# Patient Record
Sex: Female | Born: 2000
Health system: Southern US, Community
[De-identification: ages and names within clinical notes are randomized; demographics above are authoritative.]

## PROBLEM LIST (undated history)

## (undated) HISTORY — PX: CYST REMOVAL NECK: SHX6281

## (undated) HISTORY — PX: TONSILLECTOMY: SUR1361

## (undated) HISTORY — PX: TONSILLECTOMY: SHX5217

---
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
## 2021-01-20 ENCOUNTER — Ambulatory Visit: Payer: Managed Care, Other (non HMO) | Admitting: Gastroenterology
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
## 2021-05-07 IMAGING — US US PELVIS COMPLETE
1 series · 15 of 25 positions shown · non-contrast
Comparison: CT 09/13/2020

CLINICAL DATA: Pelvic pain, possible follicle seen in the right
adnexa on comparison CT 09/13/2020

EXAM:
TRANSABDOMINAL ULTRASOUND OF PELVIS
DOPPLER ULTRASOUND OF OVARIES
TECHNIQUE: Transabdominal ultrasound examination of the pelvis was performed
including evaluation of the uterus, ovaries, adnexal regions, and
pelvic cul-de-sac.
Color and duplex Doppler ultrasound was utilized to evaluate blood
flow to the ovaries.

[Series 1: us transvaginal non-ob · 15 of 62 slices shown]
[im 1/62]
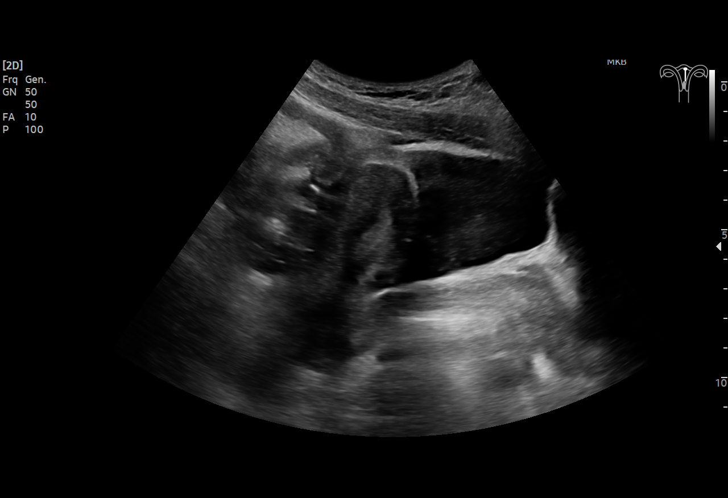
[im 6/62]
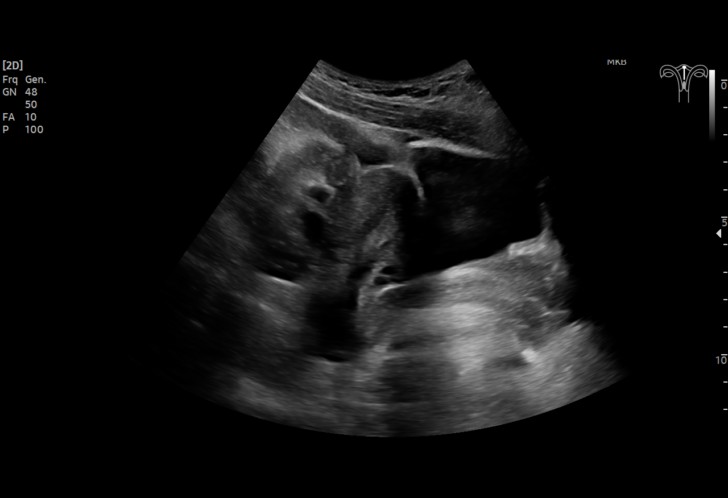
[im 11/62]
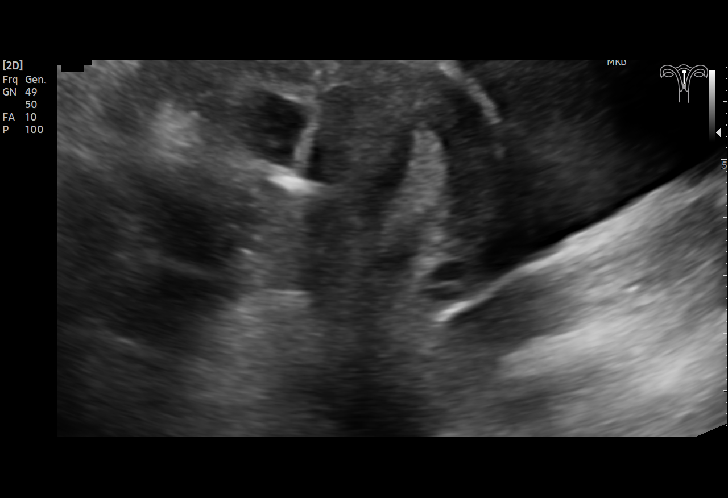
[im 13/62]
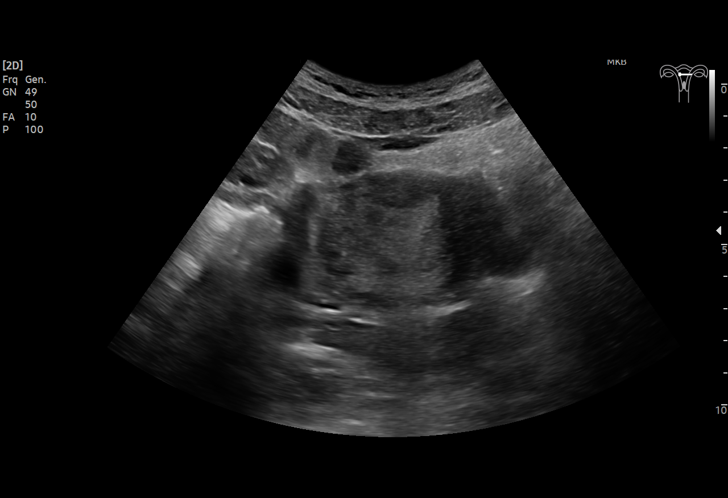
[im 18/62]
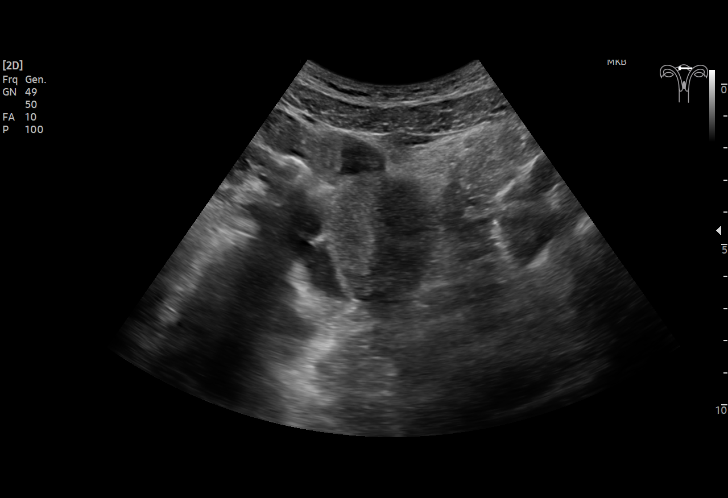
[im 23/62]
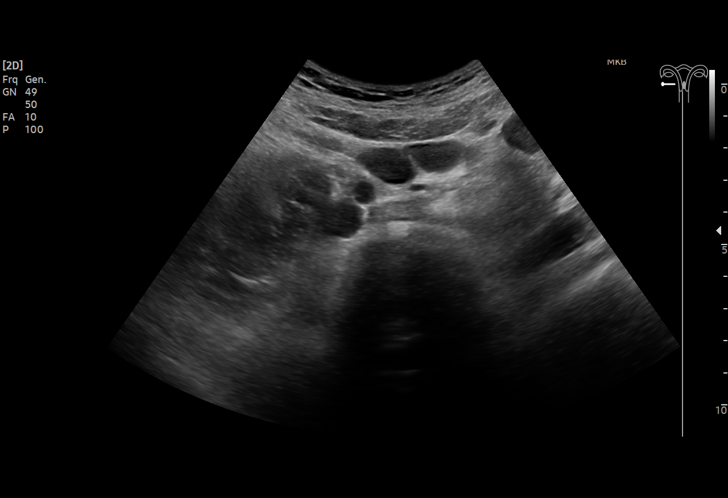
[im 26/62]
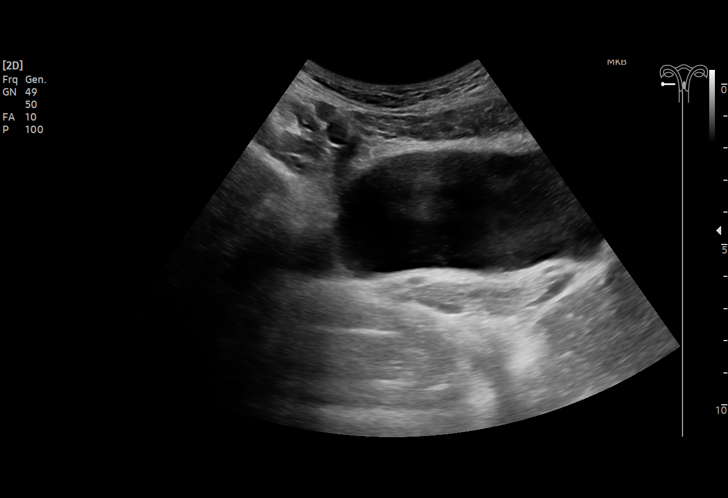
[im 31/62]
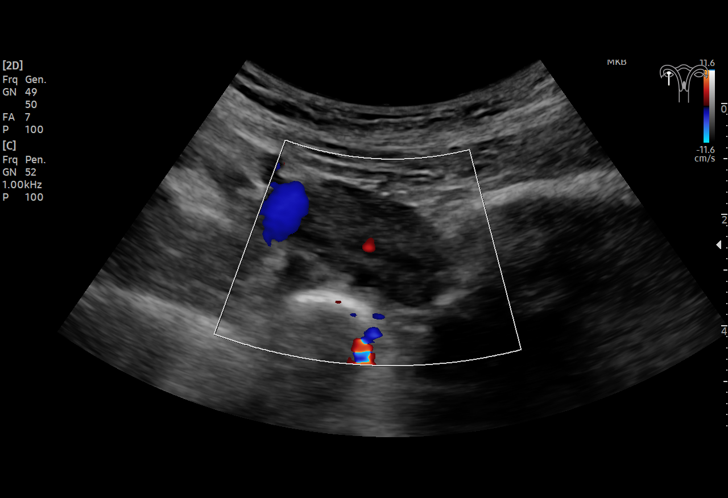
[im 36/62]
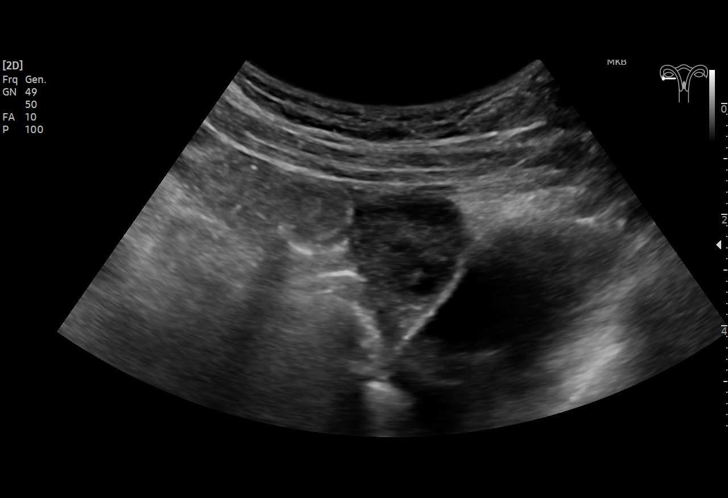
[im 39/62]
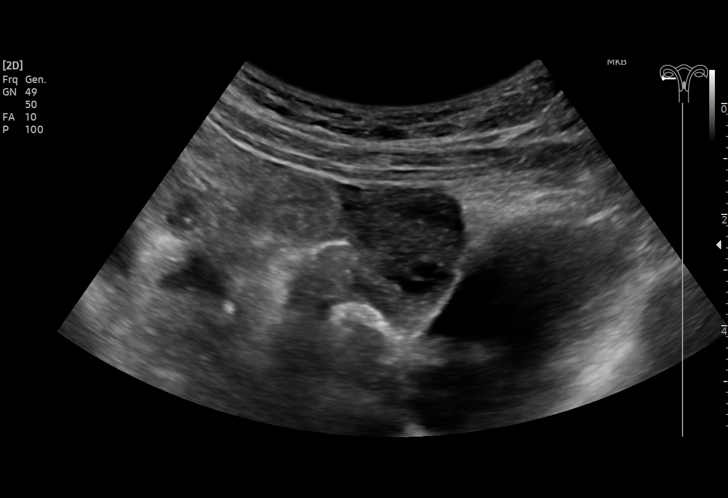
[im 44/62]
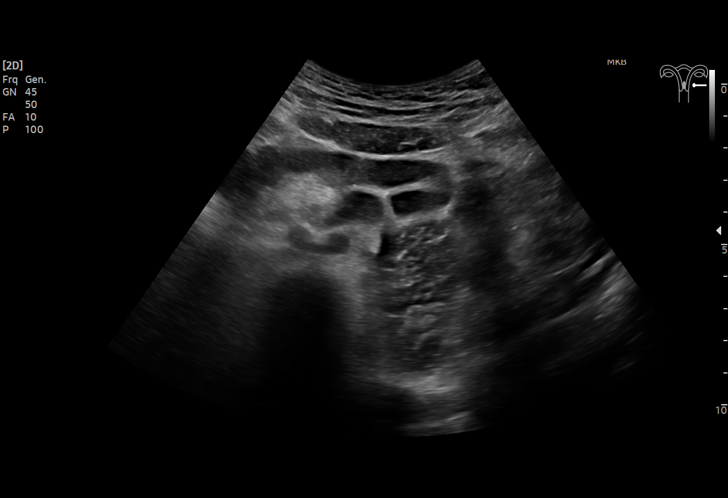
[im 49/62]
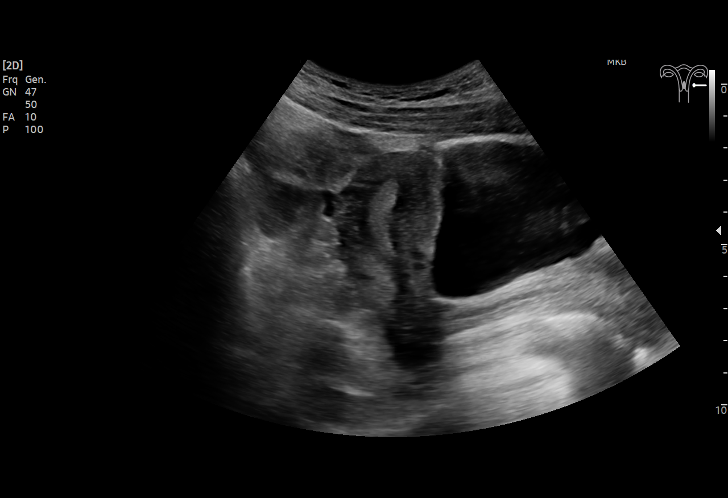
[im 51/62]
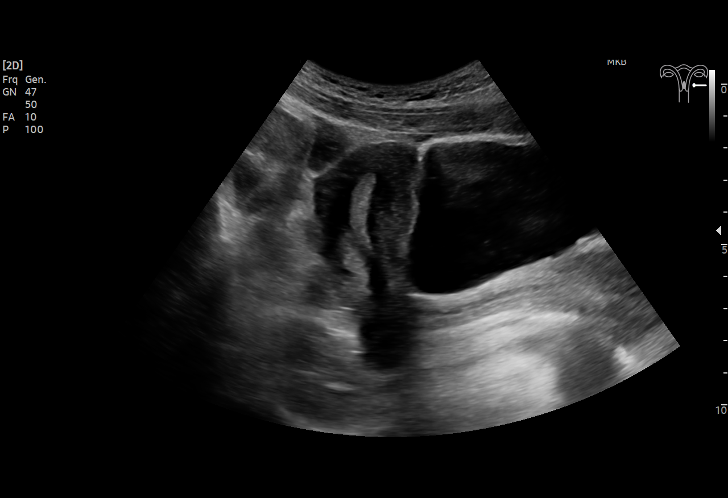
[im 56/62]
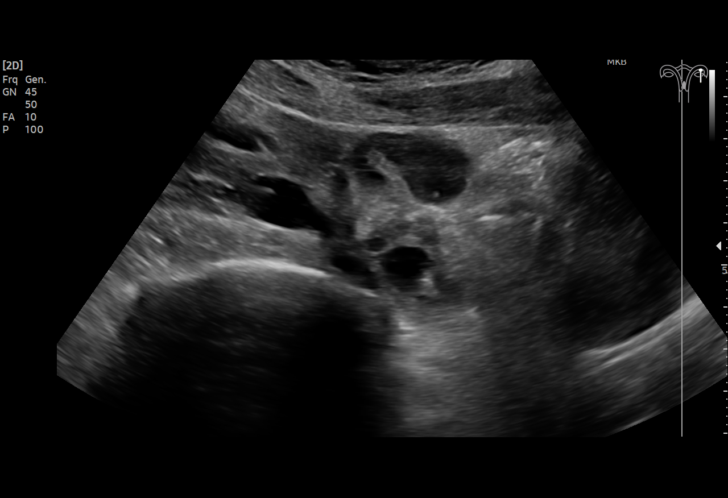
[im 62/62]
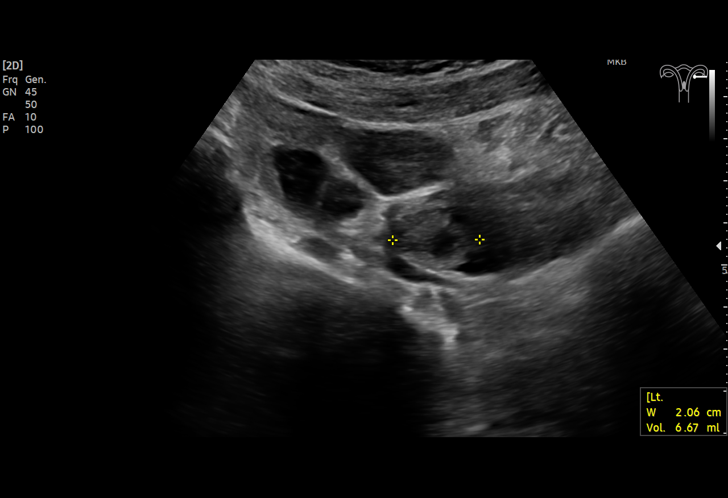

[15 of 25 positions shown; findings below may reference images not displayed]

FINDINGS: Uterus

Measurements: 6.9 x 3.3 x 5.6 cm = volume: 64.9 mL. No fibroids or
other mass visualized.

Endometrium

Thickness: 7.1 mm, non thickened.  No focal abnormality visualized.

Right ovary

Measurements: 3.0 x 1.7 x 2.1 cm = volume: 5.4 mL. No concerning
adnexal masses or lesions. Few normal follicles are present.

Left ovary

Measurements: 3.1 x 2.0 x 2.1 cm = volume: 6.7 mL. No concerning
adnexal masses or lesions. Few normal follicles are present.

Pulsed Doppler evaluation demonstrates normal low-resistance
arterial and venous waveforms in both ovaries.

Other: No free fluid.
IMPRESSION: No evidence of ovarian torsion or other acute pelvic abnormality.

Low-attenuation focus on comparison CT imaging a likely reflected a
follicle/adnexal cyst which is a normal physiologic finding. No
follow-up imaging is typically warranted for such lesions.
Additional normal small sub 3 cm follicles are noted in both ovaries
on today's examination. No further follow-up imaging is recommended.
Note: This recommendation does not apply to premenarchal patients or
to those with increased risk (genetic, family history, elevated
tumor markers or other high-risk factors) of ovarian cancer.
Reference: Radiology [DATE]):[id number].

## 2022-10-04 ENCOUNTER — Emergency Department (HOSPITAL_BASED_OUTPATIENT_CLINIC_OR_DEPARTMENT_OTHER)
Admission: EM | Admit: 2022-10-04 | Discharge: 2022-10-04 | Disposition: A | Discharge status: Home / Self Care | Payer: BC Managed Care – PPO | Attending: Emergency Medicine | Admitting: Emergency Medicine

## 2022-10-04 ENCOUNTER — Other Ambulatory Visit (HOSPITAL_BASED_OUTPATIENT_CLINIC_OR_DEPARTMENT_OTHER): Payer: Self-pay

## 2022-10-04 ENCOUNTER — Other Ambulatory Visit: Payer: Self-pay

## 2022-10-04 ENCOUNTER — Encounter (HOSPITAL_BASED_OUTPATIENT_CLINIC_OR_DEPARTMENT_OTHER): Payer: Self-pay

## 2022-10-04 DIAGNOSIS — R109 Unspecified abdominal pain: Secondary | ICD-10-CM | POA: Diagnosis not present

## 2022-10-04 DIAGNOSIS — R112 Nausea with vomiting, unspecified: Secondary | ICD-10-CM | POA: Insufficient documentation

## 2022-10-04 DIAGNOSIS — Z1152 Encounter for screening for COVID-19: Secondary | ICD-10-CM | POA: Diagnosis not present

## 2022-10-04 DIAGNOSIS — R197 Diarrhea, unspecified: Secondary | ICD-10-CM | POA: Diagnosis not present

## 2022-10-04 LAB — RESP PANEL BY RT-PCR (RSV, FLU A&B, COVID)  RVPGX2
Influenza A by PCR: NEGATIVE
Influenza B by PCR: NEGATIVE
Resp Syncytial Virus by PCR: NEGATIVE
SARS Coronavirus 2 by RT PCR: NEGATIVE

## 2022-10-04 LAB — URINALYSIS, ROUTINE W REFLEX MICROSCOPIC
Bacteria, UA: NONE SEEN
Bilirubin Urine: NEGATIVE
Glucose, UA: NEGATIVE mg/dL
Ketones, ur: 15 mg/dL — AB
Leukocytes,Ua: NEGATIVE
Nitrite: NEGATIVE
Specific Gravity, Urine: 1.033 — ABNORMAL HIGH (ref 1.005–1.030)
pH: 5.5 (ref 5.0–8.0)

## 2022-10-04 LAB — CBC WITH DIFFERENTIAL/PLATELET
Abs Immature Granulocytes: 0.02 10*3/uL (ref 0.00–0.07)
Basophils Absolute: 0 10*3/uL (ref 0.0–0.1)
Basophils Relative: 0 %
Eosinophils Absolute: 0 10*3/uL (ref 0.0–0.5)
Eosinophils Relative: 0 %
HCT: 43.1 % (ref 36.0–46.0)
Hemoglobin: 15.6 g/dL — ABNORMAL HIGH (ref 12.0–15.0)
Immature Granulocytes: 0 %
Lymphocytes Relative: 9 %
Lymphs Abs: 0.7 10*3/uL (ref 0.7–4.0)
MCH: 32.8 pg (ref 26.0–34.0)
MCHC: 36.2 g/dL — ABNORMAL HIGH (ref 30.0–36.0)
MCV: 90.5 fL (ref 80.0–100.0)
Monocytes Absolute: 0.5 10*3/uL (ref 0.1–1.0)
Monocytes Relative: 6 %
Neutro Abs: 6.4 10*3/uL (ref 1.7–7.7)
Neutrophils Relative %: 85 %
Platelets: 244 10*3/uL (ref 150–400)
RBC: 4.76 MIL/uL (ref 3.87–5.11)
RDW: 11.9 % (ref 11.5–15.5)
WBC: 7.6 10*3/uL (ref 4.0–10.5)
nRBC: 0 % (ref 0.0–0.2)

## 2022-10-04 LAB — COMPREHENSIVE METABOLIC PANEL
ALT: 21 U/L (ref 0–44)
AST: 19 U/L (ref 15–41)
Albumin: 4.8 g/dL (ref 3.5–5.0)
Alkaline Phosphatase: 48 U/L (ref 38–126)
Anion gap: 11 (ref 5–15)
BUN: 15 mg/dL (ref 6–20)
CO2: 25 mmol/L (ref 22–32)
Calcium: 10 mg/dL (ref 8.9–10.3)
Chloride: 100 mmol/L (ref 98–111)
Creatinine, Ser: 0.86 mg/dL (ref 0.44–1.00)
GFR, Estimated: >60 mL/min (ref >60)
Glucose, Bld: 103 mg/dL — ABNORMAL HIGH (ref 70–99)
Potassium: 3.4 mmol/L — ABNORMAL LOW (ref 3.5–5.1)
Sodium: 136 mmol/L (ref 135–145)
Total Bilirubin: 1.3 mg/dL — ABNORMAL HIGH (ref 0.3–1.2)
Total Protein: 7.8 g/dL (ref 6.5–8.1)

## 2022-10-04 LAB — LIPASE, BLOOD: Lipase: <10 U/L — ABNORMAL LOW (ref 11–51)

## 2022-10-04 LAB — PREGNANCY, URINE: Preg Test, Ur: NEGATIVE

## 2022-10-04 MED ORDER — ONDANSETRON HCL 4 MG PO TABS
4.0000 mg | ORAL_TABLET | Freq: Three times a day (TID) | ORAL | 0 refills | Status: AC | PRN
  Filled 2022-10-04: qty 5, 2d supply, fill #0

## 2022-10-04 MED ORDER — SUCRALFATE 1 G PO TABS
1.0000 g | ORAL_TABLET | Freq: Three times a day (TID) | ORAL | 0 refills | Status: AC
  Filled 2022-10-04: qty 21, 7d supply, fill #0

## 2022-10-04 MED ORDER — ONDANSETRON HCL 4 MG/2ML IJ SOLN
4.0000 mg | Freq: Once | INTRAMUSCULAR | Status: AC
  Administered 2022-10-04: 4 mg via INTRAVENOUS
  Filled 2022-10-04: qty 2

## 2022-10-04 MED ORDER — SODIUM CHLORIDE 0.9 % IV BOLUS
1000.0000 mL | Freq: Once | INTRAVENOUS | Status: AC
  Administered 2022-10-04: 1000 mL via INTRAVENOUS

## 2022-10-04 MED ORDER — LIDOCAINE VISCOUS HCL 2 % MT SOLN
15.0000 mL | Freq: Once | OROMUCOSAL | Status: AC
  Administered 2022-10-04: 15 mL via ORAL
  Filled 2022-10-04: qty 15

## 2022-10-04 MED ORDER — ALUM & MAG HYDROXIDE-SIMETH 200-200-20 MG/5ML PO SUSP
30.0000 mL | Freq: Once | ORAL | Status: AC
  Administered 2022-10-04: 30 mL via ORAL
  Filled 2022-10-04: qty 30

## 2022-10-04 MED ORDER — PANTOPRAZOLE SODIUM 20 MG PO TBEC
20.0000 mg | DELAYED_RELEASE_TABLET | Freq: Every day | ORAL | 0 refills | Status: AC
  Filled 2022-10-04: qty 14, 14d supply, fill #0

## 2022-10-04 NOTE — ED Notes (Signed)
Discharge paperwork given and verbally understood. 

## 2022-10-04 NOTE — ED Provider Notes (Signed)
[facility] Provider Note  CSN: [id number] Arrival date & time: 10/04/22 1230  Chief Complaint(s) Emesis  HPI Maria Arellano is a 22 y.o. female with past medical history as below, significant for no significant medical history who presents to the ED with complaint of abdominal pain, nausea, vomiting, diarrhea.  Patient accompanied by grandmother.  Patient reports that yesterday she ate very spicy food with copious amounts of hot sauce, began having epigastric discomfort, nausea, vomiting, diarrhea that is progressively worsened over the past 24 hours.  Pain described as burning, cramping sensation.  Diarrhea copious, no BRBPR or melena, no recent travel or sick contacts.  No history of prior abdominal surgery.  Past Medical History History reviewed. No pertinent past medical history. Patient Active Problem List   Diagnosis Date Noted   LUQ abdominal pain 11/16/2020   LLQ abdominal pain 11/16/2020   Home Medication(s) Prior to Admission medications   Medication Sig Start Date End Date Taking? Authorizing Provider  ondansetron (ZOFRAN) 4 MG tablet Take 1 tablet (4 mg total) by mouth every 8 (eight) hours as needed for nausea or vomiting. 10/04/22  Yes Wynona Dove A, DO  pantoprazole (PROTONIX) 20 MG tablet Take 1 tablet (20 mg total) by mouth daily for 14 days. 10/04/22 10/18/22 Yes Wynona Dove A, DO  sucralfate (CARAFATE) 1 g tablet Take 1 tablet (1 g total) by mouth with breakfast, with lunch, and with evening meal for 7 days. 10/04/22 10/11/22 Yes Jeanell Sparrow, DO  dicyclomine (BENTYL) 10 MG capsule Take 1 capsule (10 mg total) by mouth 2 (two) times daily as needed for spasms (abdominal pain). 11/16/20   Noralyn Pick, NP  famotidine (PEPCID) 20 MG tablet Take 1 tablet (20 mg total) by mouth at bedtime. 11/16/20   Noralyn Pick, NP  Sodium Phosphates (FLEET ENEMA RE) Place 1 enema rectally as needed.    [provider]                                                                                                                                     Past Surgical History Past Surgical History:  Procedure Laterality Date   CYST REMOVAL NECK Bilateral    TONSILLECTOMY N/A    Family History Family History  Problem Relation Age of Onset   Colon polyps Maternal Grandfather    Diabetes Paternal Grandmother    Cancer Paternal Grandfather        unknown type   Colon cancer Neg Hx    Esophageal cancer Neg Hx    Pancreatic cancer Neg Hx    Stomach cancer Neg Hx    Liver disease Neg Hx     Social History Social History   Tobacco Use   Smoking status: Never   Smokeless tobacco: Never  Substance Use Topics   Alcohol use: Yes    Comment: Occ   Drug use: No  Allergies Patient has no known allergies.  Review of Systems Review of Systems  Constitutional:  Negative for activity change and fever.  HENT:  Negative for facial swelling and trouble swallowing.   Eyes:  Negative for discharge and redness.  Respiratory:  Negative for cough and shortness of breath.   Cardiovascular:  Negative for chest pain and palpitations.  Gastrointestinal:  Positive for abdominal pain, diarrhea, nausea and vomiting.  Genitourinary:  Negative for dysuria and flank pain.  Musculoskeletal:  Negative for back pain and gait problem.  Skin:  Negative for pallor and rash.  Neurological:  Negative for syncope and headaches.    Physical Exam Vital Signs  I have reviewed the triage vital signs BP (!) 106/50 (BP Location: Right Arm)   Pulse 97   Temp 99.3 F (37.4 C) (Oral)   Resp 16   Ht 5\' 5"  (1.651 m)   Wt 54.4 kg   SpO2 96%   BMI 19.97 kg/m  Physical Exam Vitals and nursing note reviewed.  Constitutional:      General: She is not in acute distress.    Appearance: Normal appearance.  HENT:     Head: Normocephalic and atraumatic.     Right Ear: External ear normal.     Left Ear: External ear normal.      Nose: Nose normal.     Mouth/Throat:     Mouth: Mucous membranes are moist.  Eyes:     General: No scleral icterus.       Right eye: No discharge.        Left eye: No discharge.  Cardiovascular:     Rate and Rhythm: Normal rate and regular rhythm.     Pulses: Normal pulses.     Heart sounds: Normal heart sounds.  Pulmonary:     Effort: Pulmonary effort is normal. No respiratory distress.     Breath sounds: Normal breath sounds.  Abdominal:     General: Abdomen is flat.     Palpations: Abdomen is soft.     Tenderness: There is abdominal tenderness. There is no guarding or rebound.       Comments: Not peritoneal   Musculoskeletal:     Cervical back: No rigidity.     Right lower leg: No edema.     Left lower leg: No edema.  Skin:    General: Skin is warm and dry.     Capillary Refill: Capillary refill takes less than 2 seconds.  Neurological:     Mental Status: She is alert and oriented to person, place, and time.     GCS: GCS eye subscore is 4. GCS verbal subscore is 5. GCS motor subscore is 6.  Psychiatric:        Mood and Affect: Mood normal.        Behavior: Behavior normal.     ED Results and Treatments Labs (all labs ordered are listed, but only abnormal results are displayed) Labs Reviewed  CBC WITH DIFFERENTIAL/PLATELET - Abnormal; Notable for the following components:      Result Value   Hemoglobin 15.6 (*)    MCHC 36.2 (*)    All other components within normal limits  COMPREHENSIVE METABOLIC PANEL - Abnormal; Notable for the following components:   Potassium 3.4 (*)    Glucose, Bld 103 (*)    Total Bilirubin 1.3 (*)    All other components within normal limits  LIPASE, BLOOD - Abnormal; Notable for the following components:   Lipase <10 (*)  All other components within normal limits  URINALYSIS, ROUTINE W REFLEX MICROSCOPIC - Abnormal; Notable for the following components:   Specific Gravity, Urine 1.033 (*)    Hgb urine dipstick SMALL (*)    Ketones,  ur 15 (*)    Protein, ur TRACE (*)    All other components within normal limits  RESP PANEL BY RT-PCR (RSV, FLU A&B, COVID)  RVPGX2  PREGNANCY, URINE                                                                                                                          Radiology No results found.  Pertinent labs & imaging results that were available during my care of the patient were reviewed by me and considered in my medical decision making (see MDM for details).  Medications Ordered in ED Medications  sodium chloride 0.9 % bolus 1,000 mL (0 mLs Intravenous Stopped 10/04/22 1323)  ondansetron (ZOFRAN) injection 4 mg (4 mg Intravenous Given 10/04/22 1301)  alum & mag hydroxide-simeth (MAALOX/MYLANTA) 200-200-20 MG/5ML suspension 30 mL (30 mLs Oral Given 10/04/22 1405)    And  lidocaine (XYLOCAINE) 2 % viscous mouth solution 15 mL (15 mLs Oral Given 10/04/22 1405)  sodium chloride 0.9 % bolus 1,000 mL (0 mLs Intravenous Stopped 10/04/22 1424)                                                                                                                                     Procedures Procedures  (including critical care time)  Medical Decision Making / ED Course    Medical Decision Making:    Maria Arellano is a 22 y.o. female w/o sig med hx here with abd pain/nausea/vom/diarrhea. The complaint involves an extensive differential diagnosis and also carries with it a high risk of complications and morbidity.  Serious etiology was considered. Ddx includes but is not limited to: Differential diagnosis includes but is not exclusive to acute cholecystitis, intrathoracic causes for epigastric abdominal pain, gastritis, duodenitis, pancreatitis, small bowel or large bowel obstruction, abdominal aortic aneurysm, hernia, gastritis, etc.   Complete initial physical exam performed, notably the patient  was nausea, HDS, no acute distress.    Reviewed and confirmed nursing documentation for past  medical history, family history, social history.  Vital signs reviewed.   Ketonuria noted, likely secondary to ongoing nausea vomiting diarrhea.  Hemoconcentration, likely secondary to dehydration.  Given  IV fluids.   Patient reassessed after GI cocktail antiemetic, IV fluids.  She is feeling much better, back to her baseline.  Tolerant p.o. intake without difficulty.  Abdominal exam is soft, nontender, nonperitoneal.  Patient presents with vomiting, diarrhea, and abdominal cramping. Sx suggestive of enteritis or food born illness. Surgical or other more serious etiology appears very unlikely. The patient is improved with ED treatment. Will discharge with observation and symptomatic treatment. Abdominal pain warnings discussed.  The patient improved significantly and was discharged in stable condition. Detailed discussions were had with the patient regarding current findings, and need for close f/u with PCP or on call doctor. The patient has been instructed to return immediately if the symptoms worsen in any way for re-evaluation. Patient verbalized understanding and is in agreement with current care plan. All questions answered prior to discharge.    Additional history obtained: -Additional history obtained from family -External records from outside source obtained and reviewed including: Chart review including previous notes, labs, imaging, consultation notes including care documentation, prior labs and imaging, prior ED visits   Lab Tests: -I ordered, reviewed, and interpreted labs.   The pertinent results include:   Labs Reviewed  CBC WITH DIFFERENTIAL/PLATELET - Abnormal; Notable for the following components:      Result Value   Hemoglobin 15.6 (*)    MCHC 36.2 (*)    All other components within normal limits  COMPREHENSIVE METABOLIC PANEL - Abnormal; Notable for the following components:   Potassium 3.4 (*)    Glucose, Bld 103 (*)    Total Bilirubin 1.3 (*)    All other components  within normal limits  LIPASE, BLOOD - Abnormal; Notable for the following components:   Lipase <10 (*)    All other components within normal limits  URINALYSIS, ROUTINE W REFLEX MICROSCOPIC - Abnormal; Notable for the following components:   Specific Gravity, Urine 1.033 (*)    Hgb urine dipstick SMALL (*)    Ketones, ur 15 (*)    Protein, ur TRACE (*)    All other components within normal limits  RESP PANEL BY RT-PCR (RSV, FLU A&B, COVID)  RVPGX2  PREGNANCY, URINE    Notable for labs stable  EKG   EKG Interpretation  Date/Time:    Ventricular Rate:    PR Interval:    QRS Duration:   QT Interval:    QTC Calculation:   R Axis:     Text Interpretation:           Imaging Studies ordered: I ordered imaging studies including na   Medicines ordered and prescription drug management: Meds ordered this encounter  Medications   sodium chloride 0.9 % bolus 1,000 mL   ondansetron (ZOFRAN) injection 4 mg   AND Linked Order Group    alum & mag hydroxide-simeth (MAALOX/MYLANTA) 200-200-20 MG/5ML suspension 30 mL    lidocaine (XYLOCAINE) 2 % viscous mouth solution 15 mL   sodium chloride 0.9 % bolus 1,000 mL   sucralfate (CARAFATE) 1 g tablet    Sig: Take 1 tablet (1 g total) by mouth with breakfast, with lunch, and with evening meal for 7 days.    Dispense:  21 tablet    Refill:  0   ondansetron (ZOFRAN) 4 MG tablet    Sig: Take 1 tablet (4 mg total) by mouth every 8 (eight) hours as needed for nausea or vomiting.    Dispense:  5 tablet    Refill:  0   pantoprazole (PROTONIX) 20 MG  tablet    Sig: Take 1 tablet (20 mg total) by mouth daily for 14 days.    Dispense:  14 tablet    Refill:  0    -I have reviewed the patients home medicines and have made adjustments as needed   Consultations Obtained: na   Cardiac Monitoring: na  Social Determinants of Health:  na   Reevaluation: After the interventions noted above, I reevaluated the patient and found that they  have resolved  Co morbidities that complicate the patient evaluation History reviewed. No pertinent past medical history.    Dispostion: Disposition decision including need for hospitalization was considered, and patient discharged from emergency department.    Final Clinical Impression(s) / ED Diagnoses Final diagnoses:  Nausea vomiting and diarrhea  Abdominal pain, unspecified abdominal location     This chart was dictated using voice recognition software.  Despite best efforts to proofread,  errors can occur which can change the documentation meaning.    Wynona Dove A, DO 10/04/22 1510

## 2022-10-04 NOTE — Discharge Instructions (Signed)
It was a pleasure caring for you today in the emergency department. ° °Please return to the emergency department for any worsening or worrisome symptoms. ° ° °

## 2022-10-04 NOTE — ED Triage Notes (Signed)
Patient here POV from Home.  Endorses N/V/D that began Last PM. Continued through today. No known fever or Cough. No Sore Throat. Some Body Aches. ABD Cramping.  NAD Noted during triage. A&Ox4. GCS 15. Ambulatory.

## 2023-11-05 ENCOUNTER — Other Ambulatory Visit: Payer: Self-pay

## 2023-11-05 ENCOUNTER — Emergency Department
Admission: EM | Admit: 2023-11-05 | Discharge: 2023-11-06 | Disposition: A | Discharge status: Home / Self Care | Attending: Emergency Medicine | Admitting: Emergency Medicine

## 2023-11-05 DIAGNOSIS — T7840XA Allergy, unspecified, initial encounter: Secondary | ICD-10-CM | POA: Diagnosis present

## 2023-11-05 DIAGNOSIS — T782XXA Anaphylactic shock, unspecified, initial encounter: Secondary | ICD-10-CM | POA: Insufficient documentation

## 2023-11-05 MED ORDER — EPINEPHRINE 0.3 MG/0.3ML IJ SOAJ
0.3000 mg | Freq: Once | INTRAMUSCULAR | Status: AC
  Administered 2023-11-05: 0.3 mg via INTRAMUSCULAR
  Filled 2023-11-05: qty 0.3

## 2023-11-05 MED ORDER — METHYLPREDNISOLONE SODIUM SUCC 125 MG IJ SOLR
125.0000 mg | Freq: Once | INTRAMUSCULAR | Status: AC
  Administered 2023-11-05: 125 mg via INTRAVENOUS
  Filled 2023-11-05: qty 2

## 2023-11-05 MED ORDER — FAMOTIDINE IN NACL 20-0.9 MG/50ML-% IV SOLN
20.0000 mg | Freq: Once | INTRAVENOUS | Status: AC
  Administered 2023-11-05: 20 mg via INTRAVENOUS
  Filled 2023-11-05: qty 50

## 2023-11-05 MED ORDER — EPINEPHRINE 0.3 MG/0.3ML IJ SOAJ: 0.3000 mg | INTRAMUSCULAR | 1 refills | Status: AC | PRN

## 2023-11-05 NOTE — ED Provider Notes (Signed)
 [facility] Provider Note    Event Date/Time   First MD Initiated Contact with Patient 11/05/23 2117     (approximate)   History   Chief Complaint Allergic Reaction   HPI  Maria Arellano is a 23 y.o. female with no significant past medical history who presents to the ED complaining of allergic reaction.  Patient reports that about an hour prior to arrival she began to have an itching feeling in the back of her throat.  This was associated with feeling like her tongue was swollen and some difficulty swallowing.  She denies significant difficulty breathing, does report diffuse itching across her entire body.  She has not had any nausea, vomiting, diarrhea, or lightheadedness.  She denies any history of similar reactions, has not taken any new medications or eaten any new foods today.     Physical Exam   Triage Vital Signs: ED Triage Vitals  Encounter Vitals Group     BP      Systolic BP Percentile      Diastolic BP Percentile      Pulse      Resp      Temp      Temp src      SpO2      Weight      Height      Head Circumference      Peak Flow      Pain Score      Pain Loc      Pain Education      Exclude from Growth Chart     Most recent vital signs: Vitals:   11/05/23 2118  BP: 110/70  Pulse: (!) 110  Resp: 18  Temp: 98 F (36.7 C)  SpO2: 98%    Constitutional: Alert and oriented. Eyes: Conjunctivae are normal. Head: Atraumatic. Nose: No congestion/rhinnorhea. Mouth/Throat: Mucous membranes are moist.  Posterior oropharynx without significant edema. Cardiovascular: Normal rate, regular rhythm. Grossly normal heart sounds.  2+ radial pulses bilaterally. Respiratory: Normal respiratory effort.  No retractions. Lungs CTAB. Gastrointestinal: Soft and nontender. No distention. Musculoskeletal: No lower extremity tenderness nor edema.  Neurologic:  Normal speech and language. No gross focal neurologic deficits are  appreciated.    ED Results / Procedures / Treatments   Labs (all labs ordered are listed, but only abnormal results are displayed) Labs Reviewed - No data to display   PROCEDURES:  Critical Care performed: Yes, see critical care procedure note(s)  .Critical Care  Performed by: Twilla Galea, MD Authorized by: Twilla Galea, MD   Critical care provider statement:    Critical care time (minutes):  30   Critical care time was exclusive of:  Separately billable procedures and treating other patients and teaching time   Critical care was necessary to treat or prevent imminent or life-threatening deterioration of the following conditions: Anaphylaxis.   Critical care was time spent personally by me on the following activities:  Development of treatment plan with patient or surrogate, discussions with consultants, evaluation of patient's response to treatment, examination of patient, ordering and review of laboratory studies, ordering and review of radiographic studies, ordering and performing treatments and interventions, pulse oximetry, re-evaluation of patient's condition and review of old charts   I assumed direction of critical care for this patient from another provider in my specialty: no      MEDICATIONS ORDERED IN ED: Medications  EPINEPHrine  (EPI-PEN) injection 0.3 mg (0.3 mg Intramuscular Given 11/05/23 2129)  methylPREDNISolone  sodium succinate (  SOLU-MEDROL ) 125 mg/2 mL injection 125 mg (125 mg Intravenous Given 11/05/23 2127)  famotidine  (PEPCID ) IVPB 20 mg premix (20 mg Intravenous New Bag/Given 11/05/23 2131)     IMPRESSION / MDM / ASSESSMENT AND PLAN / ED COURSE  I reviewed the triage vital signs and the nursing notes.                              23 y.o. female with no significant past medical history who presents to the ED complaining of sudden onset itchy throat, difficulty swallowing, and diffuse itchy rash.  Patient's presentation is most consistent with  acute presentation with potential threat to life or bodily function.  Differential diagnosis includes, but is not limited to, anaphylaxis, allergic reaction, globus sensation.  Patient nontoxic-appearing and in no acute distress, vital signs are unremarkable.  She reports difficulty swallowing and feels like her throat is swollen, however no visible edema noted on exam.  Given acute onset with associated itchy rash, presentation concerning for anaphylaxis.  Patient was given IM epinephrine  along with IV Solu-Medrol  and IV Pepcid .  She did report taking Benadryl prior to arrival.  Symptoms now improving and we will observe for recurrence.  Patient turned over to oncoming divider pending observation to ensure no recurrent anaphylaxis.      FINAL CLINICAL IMPRESSION(S) / ED DIAGNOSES   Final diagnoses:  Anaphylaxis, initial encounter     Rx / DC Orders   ED Discharge Orders          Ordered    EPINEPHrine  0.3 mg/0.3 mL IJ SOAJ injection  As needed        11/05/23 2225             Note:  This document was prepared using Dragon voice recognition software and may include unintentional dictation errors.   Twilla Galea, MD 11/05/23 2228

## 2023-11-05 NOTE — ED Notes (Signed)
 Swelling to tongue has lessened. NAD. Family at bedside. Pt resting with blanket on.

## 2023-11-05 NOTE — ED Triage Notes (Addendum)
 Pt to ed from home via POV for allergic reaction. Pt tongue is swelling and she is having a hard time swallowing. No previous HX of allergic reactions. Pt is itching and SOB. Pt roomed.

## 2023-11-06 NOTE — ED Provider Notes (Signed)
 Stable after observation period for discharge.   Buell Carmin, MD 11/06/23 [phone]
